# Patient Record
Sex: Female | Born: 1995 | Race: White | Hispanic: No | Marital: Single | Smoking: Current every day smoker
Health system: Southern US, Community
[De-identification: ages and names within clinical notes are randomized; demographics above are authoritative.]

## PROBLEM LIST (undated history)

## (undated) DIAGNOSIS — Z9119 Patient's noncompliance with other medical treatment and regimen: Secondary | ICD-10-CM

## (undated) DIAGNOSIS — F32A Depression, unspecified: Secondary | ICD-10-CM

## (undated) DIAGNOSIS — E11649 Type 2 diabetes mellitus with hypoglycemia without coma: Secondary | ICD-10-CM

## (undated) DIAGNOSIS — E889 Metabolic disorder, unspecified: Secondary | ICD-10-CM

## (undated) DIAGNOSIS — B009 Herpesviral infection, unspecified: Secondary | ICD-10-CM

## (undated) DIAGNOSIS — E049 Nontoxic goiter, unspecified: Secondary | ICD-10-CM

## (undated) DIAGNOSIS — E349 Endocrine disorder, unspecified: Secondary | ICD-10-CM

## (undated) DIAGNOSIS — E1143 Type 2 diabetes mellitus with diabetic autonomic (poly)neuropathy: Secondary | ICD-10-CM

## (undated) DIAGNOSIS — R000 Tachycardia, unspecified: Secondary | ICD-10-CM

## (undated) DIAGNOSIS — F341 Dysthymic disorder: Secondary | ICD-10-CM

## (undated) DIAGNOSIS — E109 Type 1 diabetes mellitus without complications: Secondary | ICD-10-CM

## (undated) DIAGNOSIS — F329 Major depressive disorder, single episode, unspecified: Secondary | ICD-10-CM

## (undated) DIAGNOSIS — Z91199 Patient's noncompliance with other medical treatment and regimen due to unspecified reason: Secondary | ICD-10-CM

## (undated) DIAGNOSIS — M1480 Arthropathies in other specified diseases classified elsewhere, unspecified site: Secondary | ICD-10-CM

## (undated) HISTORY — DX: Tachycardia, unspecified: R00.0

## (undated) HISTORY — DX: Type 2 diabetes mellitus with diabetic autonomic (poly)neuropathy: E11.43

## (undated) HISTORY — DX: Patient's noncompliance with other medical treatment and regimen due to unspecified reason: Z91.199

## (undated) HISTORY — DX: Type 1 diabetes mellitus without complications: E10.9

## (undated) HISTORY — DX: Endocrine disorder, unspecified: E34.9

## (undated) HISTORY — DX: Endocrine disorder, unspecified: E88.9

## (undated) HISTORY — DX: Nontoxic goiter, unspecified: E04.9

## (undated) HISTORY — DX: Arthropathies in other specified diseases classified elsewhere, unspecified site: M14.80

## (undated) HISTORY — DX: Type 2 diabetes mellitus with hypoglycemia without coma: E11.649

## (undated) HISTORY — DX: Patient's noncompliance with other medical treatment and regimen: Z91.19

## (undated) HISTORY — PX: TONSILLECTOMY: SUR1361

## (undated) HISTORY — PX: TONSILLECTOMY AND ADENOIDECTOMY: SHX28

---
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
## 2017-10-30 IMAGING — CR DG CHEST 1V PORT
1 series · 1 of 1 positions shown · non-contrast
Comparison: 08/26/2015

CLINICAL DATA: Chest pain and vomiting

EXAM:
PORTABLE CHEST 1 VIEW

[AP]
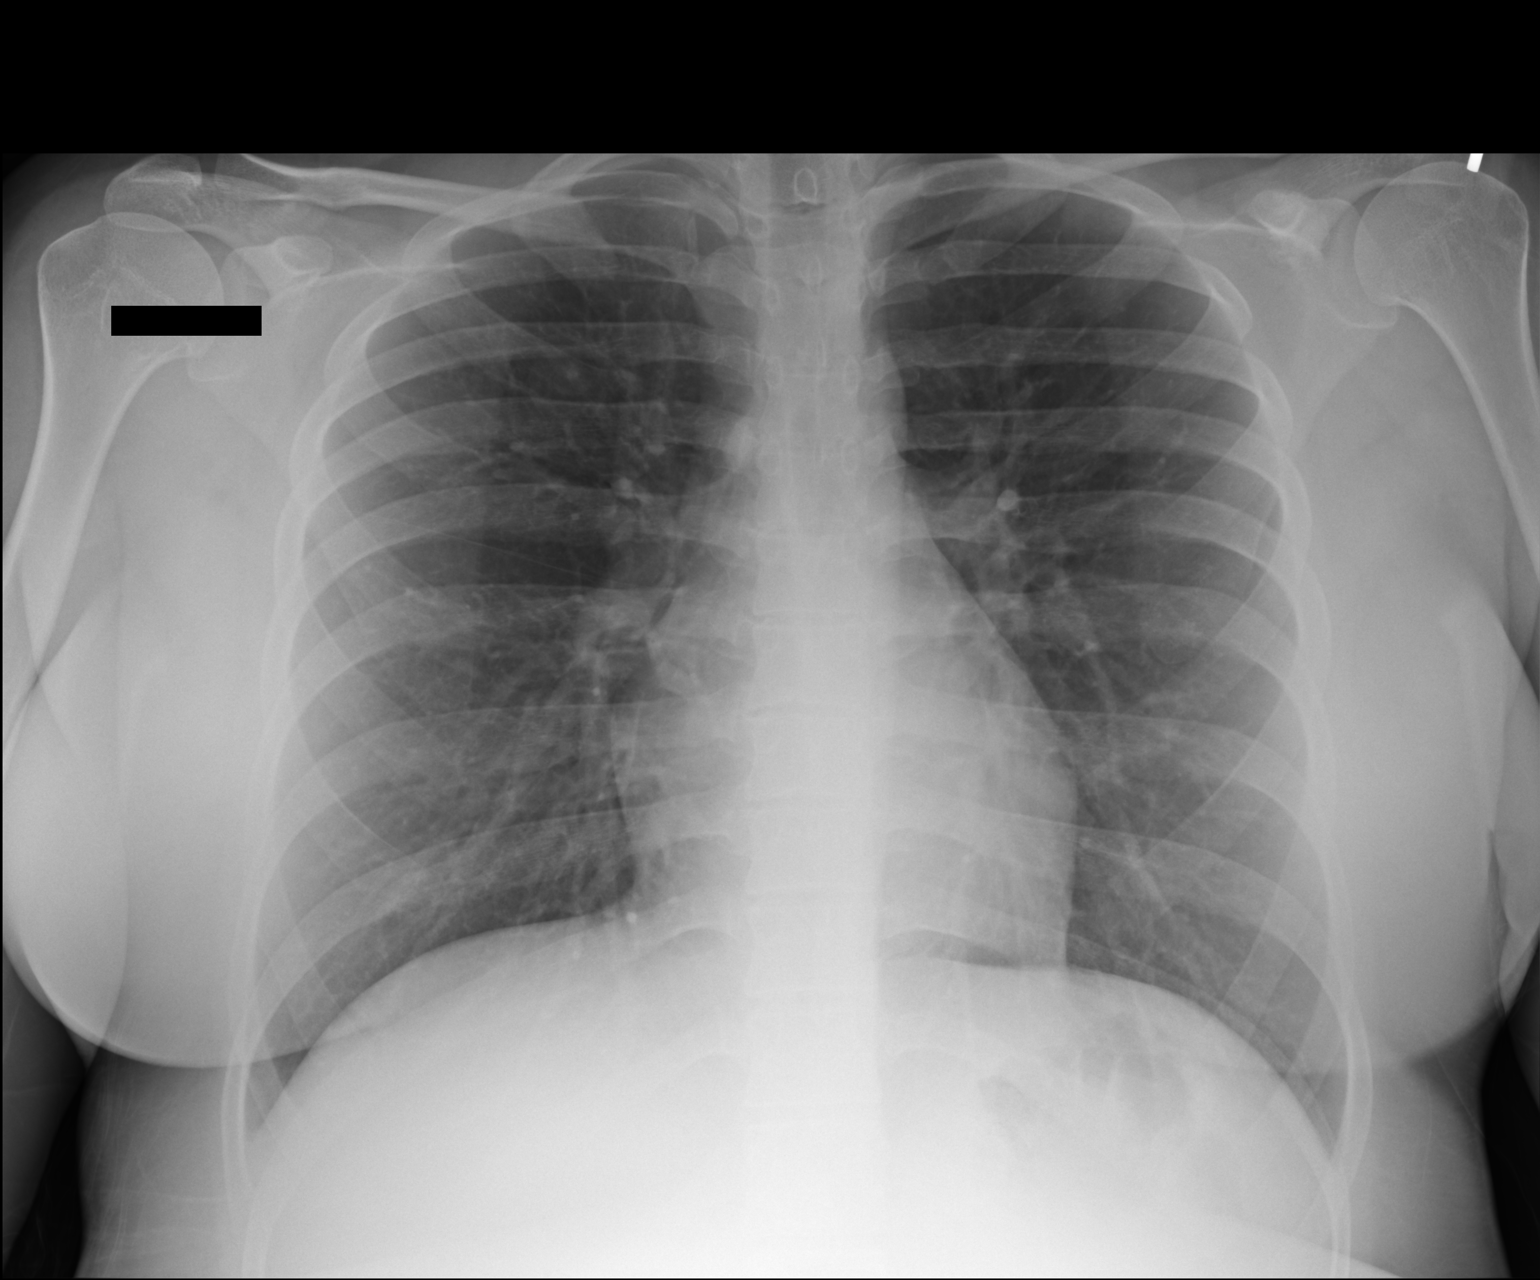

[1 of 1 positions shown; findings below may reference images not displayed]

FINDINGS: The heart size and mediastinal contours are within normal limits.
Both lungs are clear. The visualized skeletal structures are
unremarkable.
IMPRESSION: No active disease.

## 2017-12-02 IMAGING — CR DG CHEST 2V
2 series · 2 of 2 positions shown · non-contrast
Comparison: 12/14/2015

CLINICAL DATA: Central chest pain onset today.

EXAM:
CHEST  2 VIEW

[w chest pa]
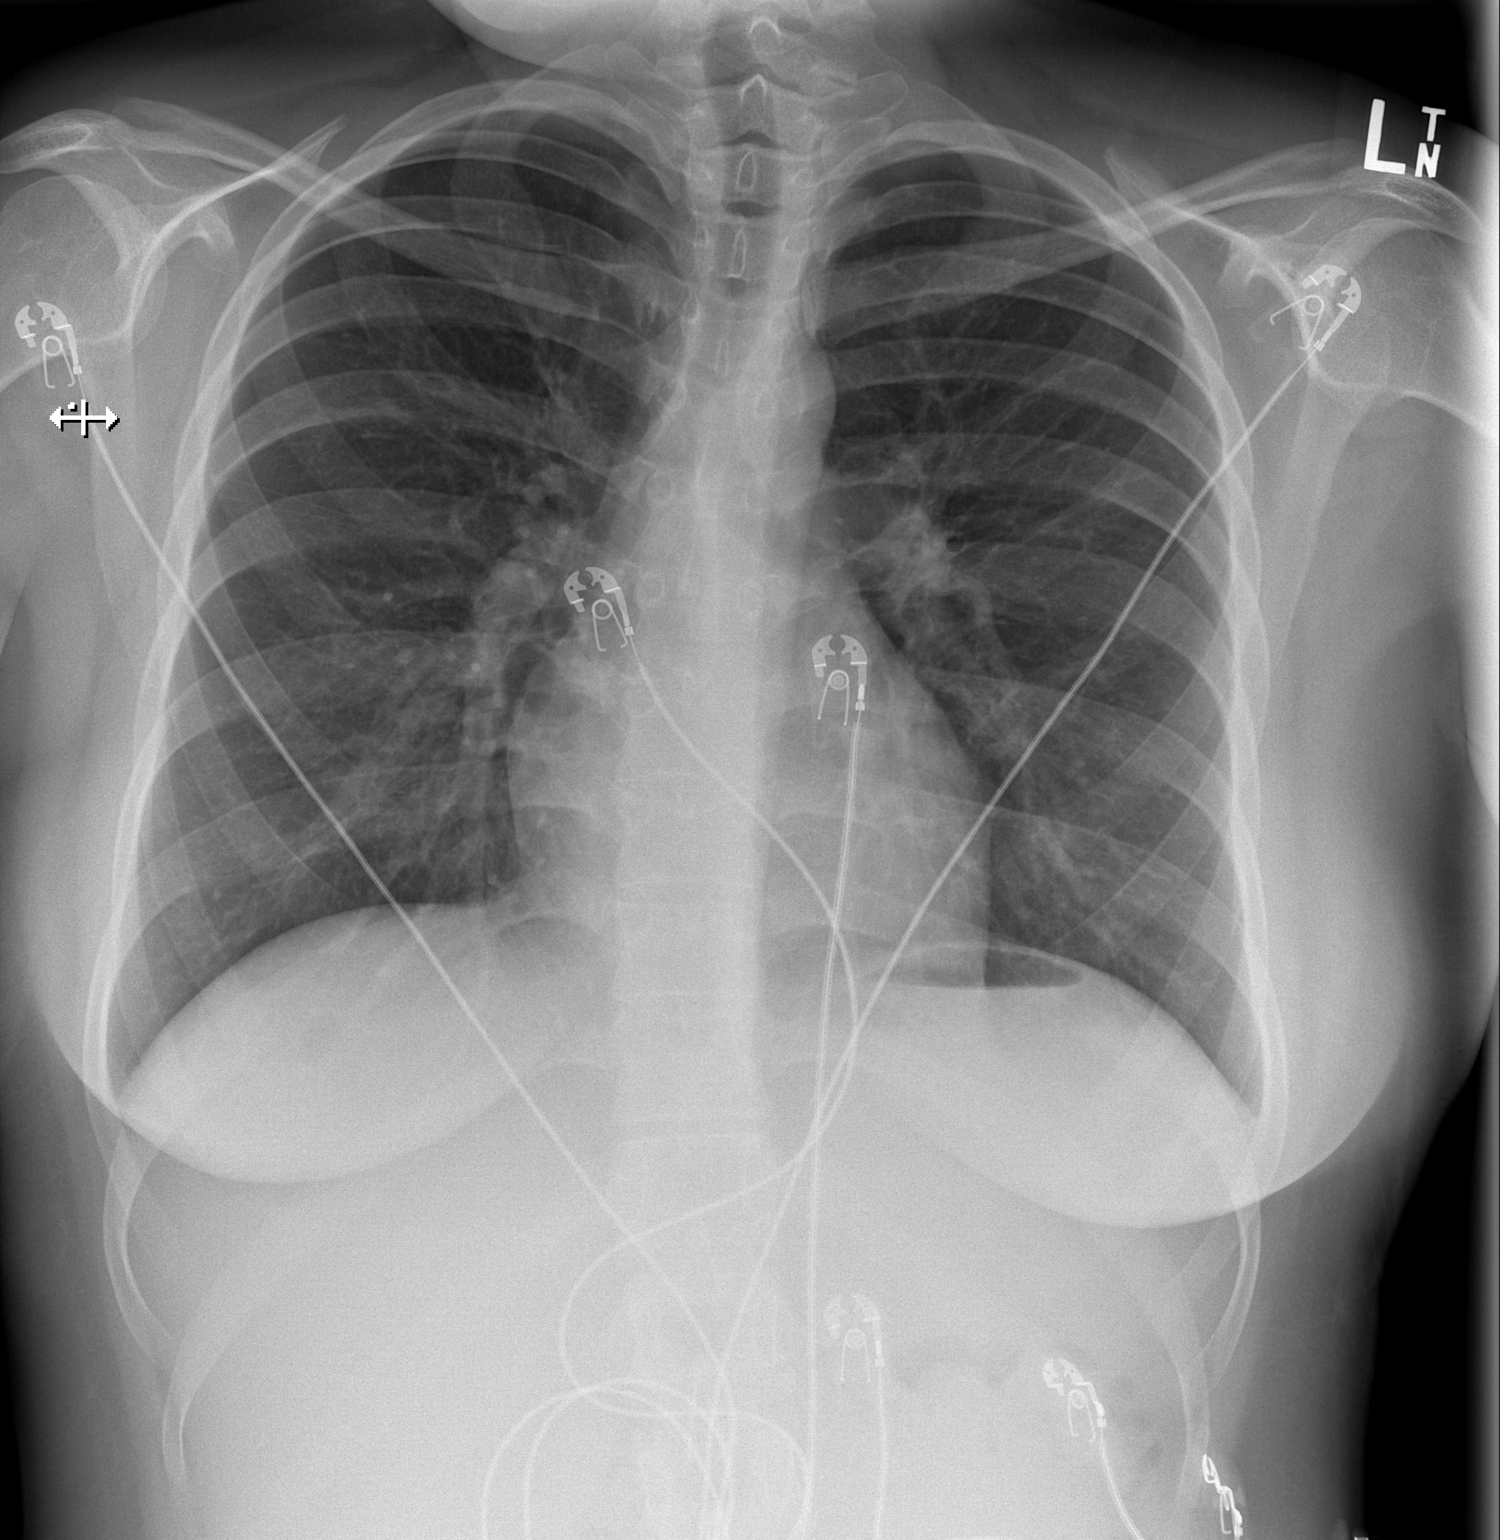

[w chest lat]
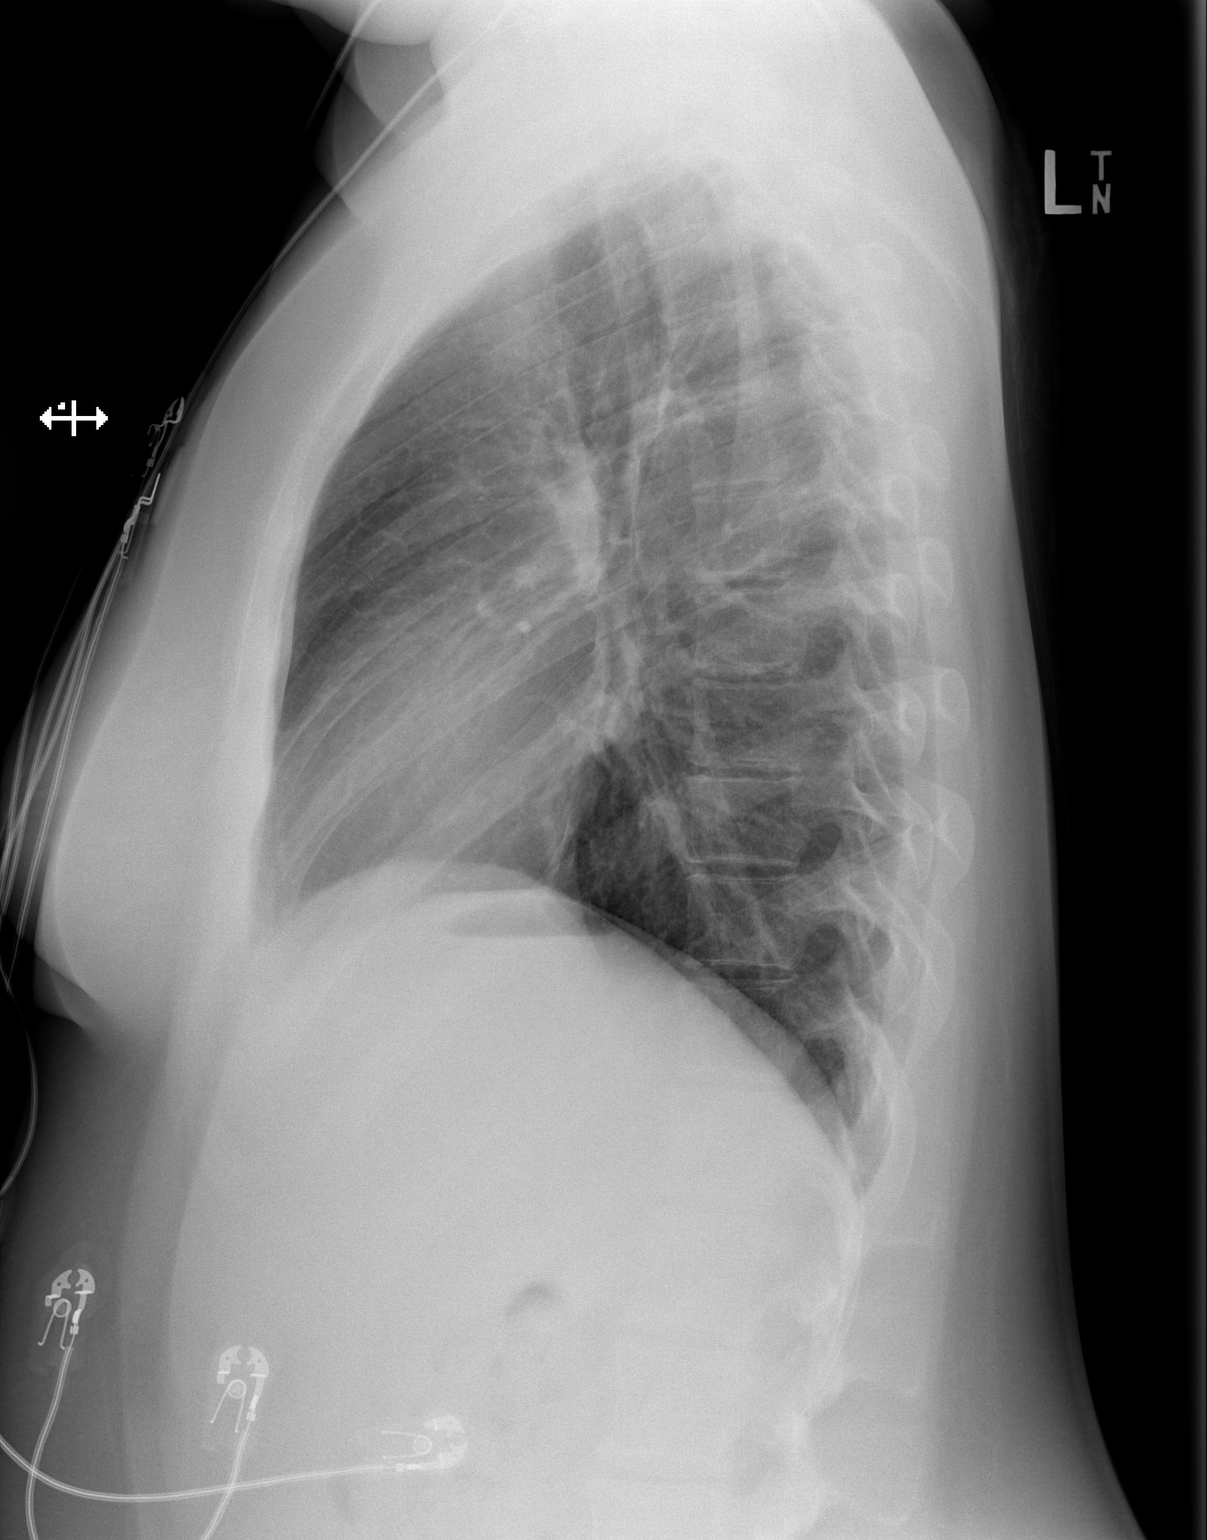

[2 of 2 positions shown; findings below may reference images not displayed]

FINDINGS: The lungs are clear. The pulmonary vasculature is normal. Heart size
is normal. Hilar and mediastinal contours are unremarkable. There is
no pleural effusion.
IMPRESSION: No active cardiopulmonary disease.

## 2018-01-06 IMAGING — CR DG CHEST 1V PORT
1 series · 1 of 1 positions shown · non-contrast
Comparison: 01/25/2016

CLINICAL DATA: Found unresponsive.  Respiratory failure.

EXAM:
PORTABLE CHEST 1 VIEW

[AP]
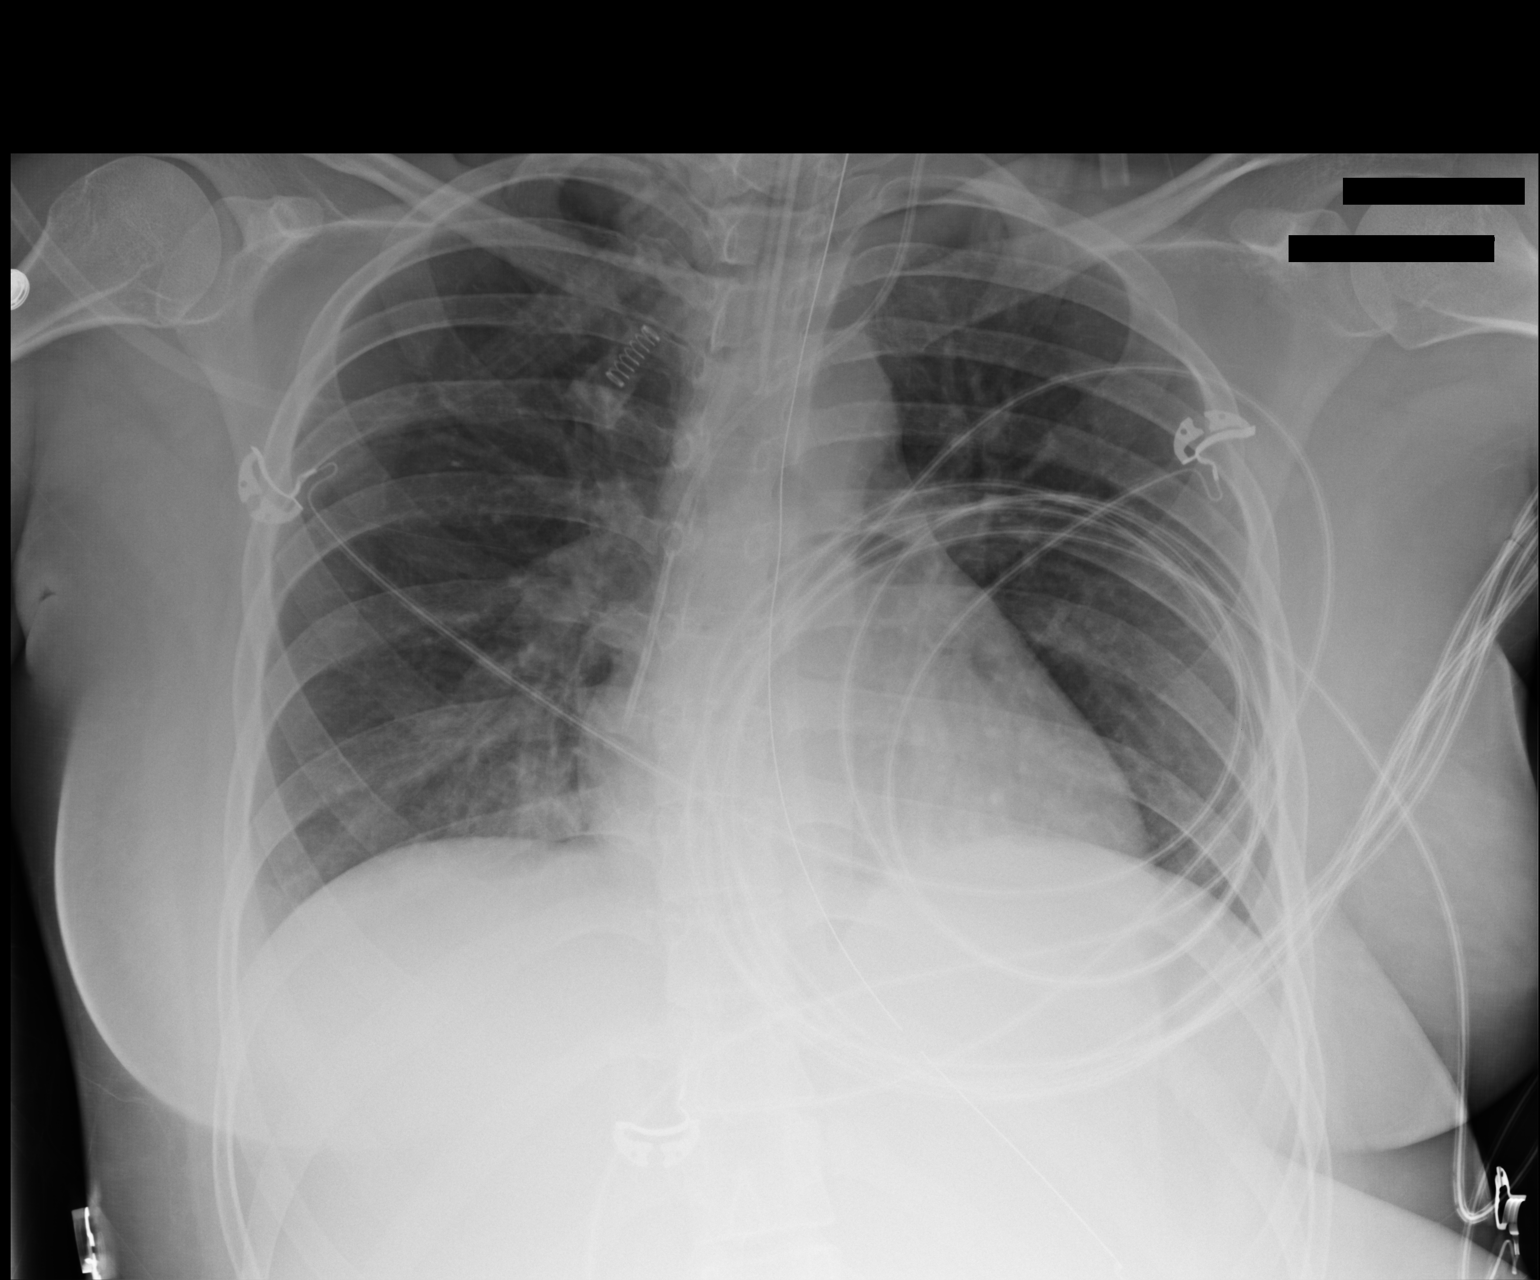

[1 of 1 positions shown; findings below may reference images not displayed]

FINDINGS: Endotracheal tube and left central line remain in place, unchanged.
Minimal bibasilar opacities, likely atelectasis. Heart is normal
size. No effusions or acute bony abnormality.
IMPRESSION: Minimal bibasilar atelectasis.

## 2023-04-07 ENCOUNTER — Other Ambulatory Visit: Payer: Self-pay | Admitting: Obstetrics
